# Patient Record
Sex: Male | Born: 1970
Health system: Southern US, Community
[De-identification: ages and names within clinical notes are randomized; demographics above are authoritative.]

## PROBLEM LIST (undated history)

## (undated) DIAGNOSIS — I10 Essential (primary) hypertension: Secondary | ICD-10-CM

---

## 2009-06-08 ENCOUNTER — Emergency Department: Payer: Self-pay | Admitting: Emergency Medicine

## 2019-03-26 ENCOUNTER — Encounter: Payer: Self-pay | Admitting: Emergency Medicine

## 2019-03-26 ENCOUNTER — Other Ambulatory Visit: Payer: Self-pay

## 2019-03-26 ENCOUNTER — Emergency Department: Payer: 59

## 2019-03-26 ENCOUNTER — Emergency Department
Admission: EM | Admit: 2019-03-26 | Discharge: 2019-03-26 | Disposition: A | Discharge status: Home / Self Care | Payer: 59 | Attending: Emergency Medicine | Admitting: Emergency Medicine

## 2019-03-26 DIAGNOSIS — I10 Essential (primary) hypertension: Secondary | ICD-10-CM

## 2019-03-26 DIAGNOSIS — F17210 Nicotine dependence, cigarettes, uncomplicated: Secondary | ICD-10-CM | POA: Insufficient documentation

## 2019-03-26 DIAGNOSIS — H81399 Other peripheral vertigo, unspecified ear: Secondary | ICD-10-CM | POA: Insufficient documentation

## 2019-03-26 DIAGNOSIS — R42 Dizziness and giddiness: Secondary | ICD-10-CM | POA: Diagnosis present

## 2019-03-26 HISTORY — DX: Essential (primary) hypertension: I10

## 2019-03-26 LAB — COMPREHENSIVE METABOLIC PANEL
ALT: 72 U/L — ABNORMAL HIGH (ref 0–44)
AST: 43 U/L — ABNORMAL HIGH (ref 15–41)
Albumin: 4.7 g/dL (ref 3.5–5.0)
Alkaline Phosphatase: 104 U/L (ref 38–126)
Anion gap: 10 (ref 5–15)
BUN: 14 mg/dL (ref 6–20)
CO2: 19 mmol/L — ABNORMAL LOW (ref 22–32)
Calcium: 9.4 mg/dL (ref 8.9–10.3)
Chloride: 108 mmol/L (ref 98–111)
Creatinine, Ser: 1.21 mg/dL (ref 0.61–1.24)
GFR calc Af Amer: >60 mL/min (ref >60)
GFR calc non Af Amer: >60 mL/min (ref >60)
Glucose, Bld: 122 mg/dL — ABNORMAL HIGH (ref 70–99)
Potassium: 4.7 mmol/L (ref 3.5–5.1)
Sodium: 137 mmol/L (ref 135–145)
Total Bilirubin: 1.5 mg/dL — ABNORMAL HIGH (ref 0.3–1.2)
Total Protein: 7.3 g/dL (ref 6.5–8.1)

## 2019-03-26 LAB — CBC WITH DIFFERENTIAL/PLATELET
Abs Immature Granulocytes: 0.03 10*3/uL (ref 0.00–0.07)
Basophils Absolute: 0 10*3/uL (ref 0.0–0.1)
Basophils Relative: 0 %
Eosinophils Absolute: 0.1 10*3/uL (ref 0.0–0.5)
Eosinophils Relative: 1 %
HCT: 46 % (ref 39.0–52.0)
Hemoglobin: 16.5 g/dL (ref 13.0–17.0)
Immature Granulocytes: 0 %
Lymphocytes Relative: 13 %
Lymphs Abs: 1 10*3/uL (ref 0.7–4.0)
MCH: 30.7 pg (ref 26.0–34.0)
MCHC: 35.9 g/dL (ref 30.0–36.0)
MCV: 85.7 fL (ref 80.0–100.0)
Monocytes Absolute: 0.4 10*3/uL (ref 0.1–1.0)
Monocytes Relative: 5 %
Neutro Abs: 6.1 10*3/uL (ref 1.7–7.7)
Neutrophils Relative %: 81 %
Platelets: 247 10*3/uL (ref 150–400)
RBC: 5.37 MIL/uL (ref 4.22–5.81)
RDW: 12.3 % (ref 11.5–15.5)
WBC: 7.6 10*3/uL (ref 4.0–10.5)
nRBC: 0 % (ref 0.0–0.2)

## 2019-03-26 LAB — URINALYSIS, COMPLETE (UACMP) WITH MICROSCOPIC
Bacteria, UA: NONE SEEN
Bilirubin Urine: NEGATIVE
Glucose, UA: NEGATIVE mg/dL
Hgb urine dipstick: NEGATIVE
Ketones, ur: NEGATIVE mg/dL
Leukocytes,Ua: NEGATIVE
Nitrite: NEGATIVE
Protein, ur: 30 mg/dL — AB
Specific Gravity, Urine: 1.027 (ref 1.005–1.030)
Squamous Epithelial / HPF: NONE SEEN (ref 0–5)
pH: 7 (ref 5.0–8.0)

## 2019-03-26 MED ORDER — MECLIZINE HCL 25 MG PO TABS
25.0000 mg | ORAL_TABLET | Freq: Once | ORAL | Status: AC
  Administered 2019-03-26: 25 mg via ORAL
  Filled 2019-03-26: qty 1

## 2019-03-26 MED ORDER — AMLODIPINE BESYLATE 5 MG PO TABS: 5.0000 mg | ORAL_TABLET | Freq: Every day | ORAL | 0 refills | Status: DC

## 2019-03-26 MED ORDER — MECLIZINE HCL 25 MG PO TABS: 25.0000 mg | ORAL_TABLET | Freq: Three times a day (TID) | ORAL | 0 refills | Status: DC | PRN

## 2019-03-26 MED ORDER — AMLODIPINE BESYLATE 5 MG PO TABS
5.0000 mg | ORAL_TABLET | Freq: Once | ORAL | Status: AC
  Administered 2019-03-26: 13:00:00 5 mg via ORAL
  Filled 2019-03-26: qty 1

## 2019-03-26 NOTE — ED Notes (Signed)
Pt ambulated down hallway with no apparent difficulty. Pt states he no longer feels dizzy when he is lying down and only slightly dizzy when ambulating. Pt alert & oriented, denies pain.

## 2019-03-26 NOTE — ED Provider Notes (Signed)
[facility] Emergency Department Provider Note   ____________________________________________   First MD Initiated Contact with Patient 03/26/19 1157     (approximate)  I have reviewed the triage vital signs and the nursing notes.   HISTORY  Chief Complaint Dizziness    HPI Ethan Lane is a 49 y.o. male with past medical history of hypertension who presents to the ED complaining of dizziness.  Patient reports that when he woke up this morning he was feeling very dizzy and unsteady on his feet with a sensation that the room was spinning around him.  It seemed to be worse when he stood up and tried to walk to the bathroom, had to lean against the wall to support himself.  He denies feeling lightheaded or like he was going to pass out, has not had any chest pain or shortness of breath.  Symptoms have persisted throughout the day today and seem to be worse with any movement of his head.  He does state that since he is arrived in the ED, he has been able to stand up and walk with only very mild dizziness.  He has not had any changes to his vision or speech and denies any focal numbness or weakness.  He does admit to a longstanding history of hypertension since he was a teenager, was only on medication for this briefly a couple of years ago.        Past Medical History:  Diagnosis Date  . Hypertension     There are no problems to display for this patient.   History reviewed. No pertinent surgical history.  Prior to Admission medications   Medication Sig Start Date End Date Taking? Authorizing Provider  amLODipine (NORVASC) 5 MG tablet Take 1 tablet (5 mg total) by mouth daily. 03/26/19 05/25/19  Chesley Noon, MD  meclizine (ANTIVERT) 25 MG tablet Take 1 tablet (25 mg total) by mouth 3 (three) times daily as needed for dizziness. 03/26/19   Chesley Noon, MD    Allergies Patient has no known allergies.  History reviewed. No pertinent family  history.  Social History Social History   Tobacco Use  . Smoking status: Current Every Day Smoker    Packs/day: 1.00    Types: Cigarettes  . Smokeless tobacco: Never Used  Substance Use Topics  . Alcohol use: Yes  . Drug use: Never    Review of Systems  Constitutional: No fever/chills Eyes: No visual changes. ENT: No sore throat. Cardiovascular: Denies chest pain. Respiratory: Denies shortness of breath. Gastrointestinal: No abdominal pain.  No nausea, no vomiting.  No diarrhea.  No constipation. Genitourinary: Negative for dysuria. Musculoskeletal: Negative for back pain. Skin: Negative for rash. Neurological: Negative for headaches, focal weakness or numbness.  Positive for dizziness.  ____________________________________________   PHYSICAL EXAM:  VITAL SIGNS: ED Triage Vitals  Enc Vitals Group     BP 03/26/19 0830 (!) 185/118     Pulse Rate 03/26/19 0830 81     Resp 03/26/19 0830 18     Temp 03/26/19 0830 98 F (36.7 C)     Temp Source 03/26/19 0830 Oral     SpO2 03/26/19 0830 99 %     Weight 03/26/19 0851 200 lb (90.7 kg)     Height 03/26/19 0851 5\' 10"  (1.778 m)     Head Circumference --      Peak Flow --      Pain Score 03/26/19 0850 0     Pain Loc --  Pain Edu? --      Excl. in [place]? --     Constitutional: Alert and oriented. Eyes: Conjunctivae are normal.  Pupils equal round and reactive to light bilaterally, extraocular movements intact without nystagmus. Head: Atraumatic. Nose: No congestion/rhinnorhea. Mouth/Throat: Mucous membranes are moist. Neck: Normal ROM Cardiovascular: Normal rate, regular rhythm. Grossly normal heart sounds. Respiratory: Normal respiratory effort.  No retractions. Lungs CTAB. Gastrointestinal: Soft and nontender. No distention. Genitourinary: deferred Musculoskeletal: No lower extremity tenderness nor edema. Neurologic:  Normal speech and language. No gross focal neurologic deficits are appreciated.  5-5 strength in  bilateral upper and lower extremities, no pronator drift. Skin:  Skin is warm, dry and intact. No rash noted. Psychiatric: Mood and affect are normal. Speech and behavior are normal.  ____________________________________________   LABS (all labs ordered are listed, but only abnormal results are displayed)  Labs Reviewed  URINALYSIS, COMPLETE (UACMP) WITH MICROSCOPIC - Abnormal; Notable for the following components:      Result Value   Color, Urine YELLOW (*)    APPearance CLEAR (*)    Protein, ur 30 (*)    All other components within normal limits  COMPREHENSIVE METABOLIC PANEL - Abnormal; Notable for the following components:   CO2 19 (*)    Glucose, Bld 122 (*)    AST 43 (*)    ALT 72 (*)    Total Bilirubin 1.5 (*)    All other components within normal limits  CBC WITH DIFFERENTIAL/PLATELET   ____________________________________________  EKG  ED ECG REPORT I, Chesley Noon, the attending physician, personally viewed and interpreted this ECG.   Date: 03/26/2019  EKG Time: 8:37  Rate: 75  Rhythm: normal sinus rhythm  Axis: Normal  Intervals:none  ST&T Change: None   PROCEDURES  Procedure(s) performed (including Critical Care):  Procedures   ____________________________________________   INITIAL IMPRESSION / ASSESSMENT AND PLAN / ED COURSE       49 year old male with longstanding history of unmanaged hypertension presents to the ED with episodes of dizziness and a sensation of the room spinning around him since waking up this morning.  He describes the symptoms as worse with any movement of his head, but they have gradually improved following his arrival here in the ED.  Doubt cardiac etiology as his EKG shows no signs of arrhythmia or ischemia, he remains in normal sinus rhythm on the monitor.  CT head is negative for acute process and he has no focal neurologic deficits on exam, given association of symptoms with head position, suspect peripheral vertigo  rather than central etiology.  Will treat with meclizine and reevaluate.  He does have a markedly elevated blood pressure here, however there is no evidence of hypertensive emergency and I suspect he has been dealing with blood pressure this high for a long time.  He would benefit from starting on amlodipine, but there is no indication for more aggressive management of his blood pressure here in the ED.  Counseled patient to establish care with a PCP for follow-up of his blood pressure, patient agrees with plan.      ____________________________________________   FINAL CLINICAL IMPRESSION(S) / ED DIAGNOSES  Final diagnoses:  Peripheral vertigo, unspecified laterality  Hypertension, unspecified type     ED Discharge Orders         Ordered    meclizine (ANTIVERT) 25 MG tablet  3 times daily PRN     03/26/19 1428    amLODipine (NORVASC) 5 MG tablet  Daily  03/26/19 1428           Note:  This document was prepared using Dragon voice recognition software and may include unintentional dictation errors.   Blake Divine, MD 03/26/19 [phone]

## 2019-03-26 NOTE — ED Triage Notes (Signed)
First RN Note: Pt presents to ED via [facility]. Per EMS pt takes low dose BP meds x 7-8 yrs, recently went on vacation and didn't take his BP meds, also recently lost his son. Per EMS at approx 0100 this morning pt began having sudden onset dizziness, blurred vision, N/V. PTA pt's BP 200/130. Per EMS pt denies HA at this time.    182/112 70 NSR

## 2019-03-26 NOTE — ED Notes (Signed)
AAOx3.  Skin warm and dry.  NAD 

## 2020-08-16 IMAGING — CT CT HEAD W/O CM
3 series · 15 of 47 positions shown, 18 images · non-contrast
Comparison: None

CLINICAL DATA: Sudden onset of dizziness, blurred vision, nausea,
vomiting, elevated blood pressure, has not been taking blood
pressure medications

EXAM:
CT HEAD WITHOUT CONTRAST
TECHNIQUE: Contiguous axial images were obtained from the base of the skull
through the vertex without intravenous contrast. Sagittal and
coronal MPR images reconstructed from axial data set.

[Series 3: head wo · axial · 0.48mm/px · z∈[-144,-14]mm · 9 of 32 slices shown, 12 images]
[im 3/32  brain]
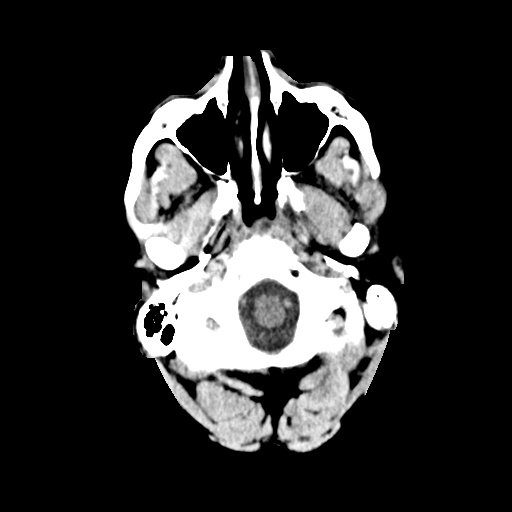
[im 3/32  bone]
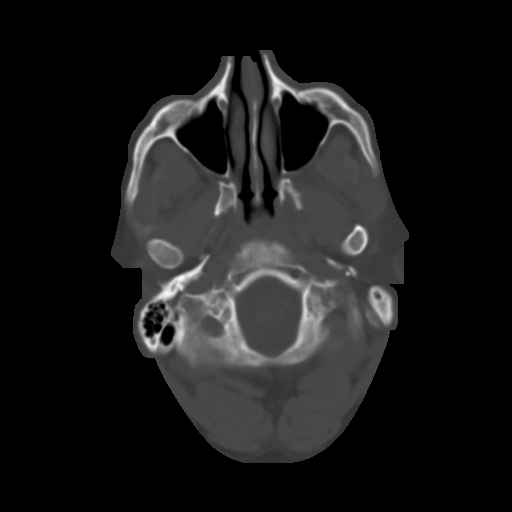
[im 6/32  brain]
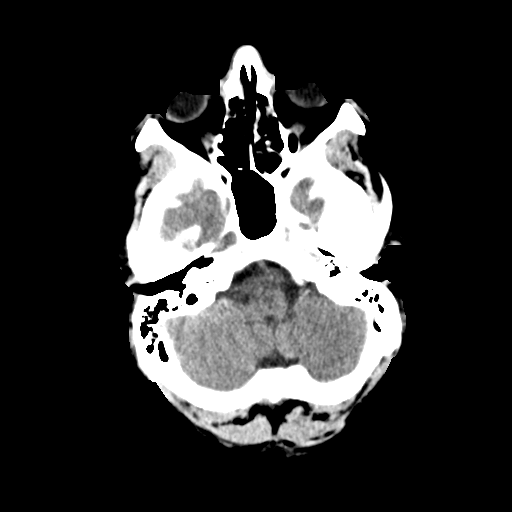
[im 9/32  brain]
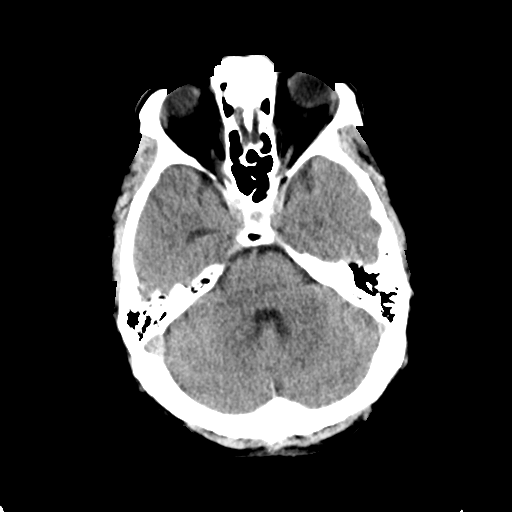
[im 12/32  brain]
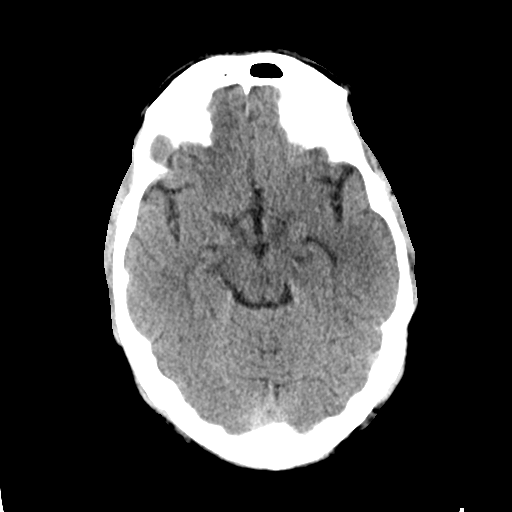
[im 17/32  brain]
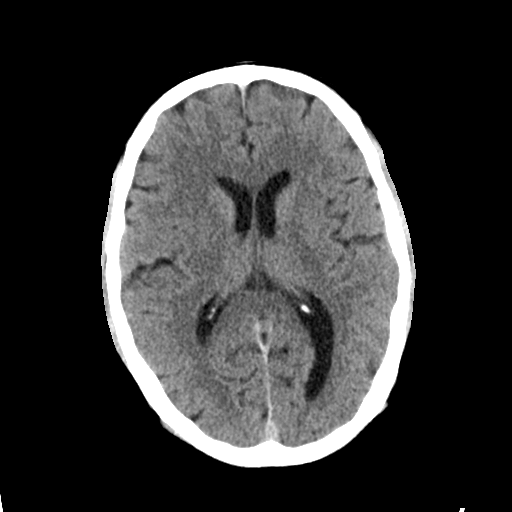
[im 17/32  bone]
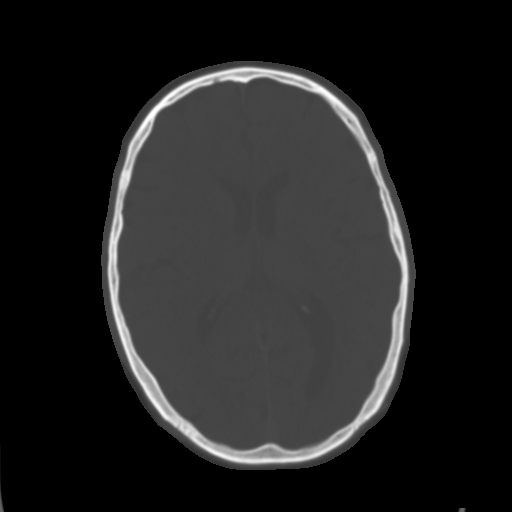
[im 20/32  brain]
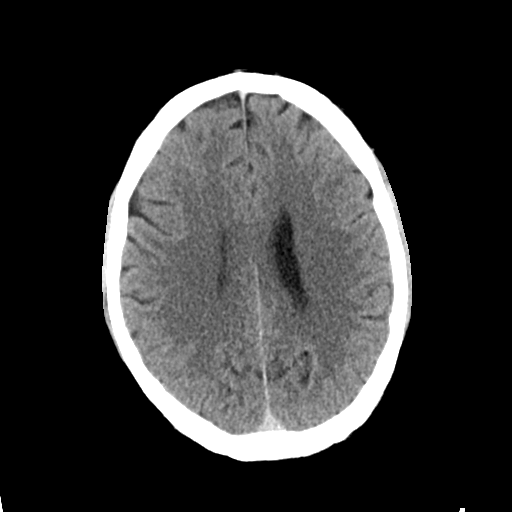
[im 23/32  brain]
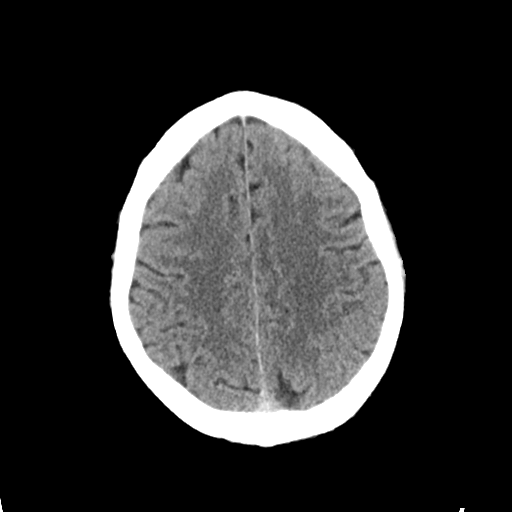
[im 26/32  brain]
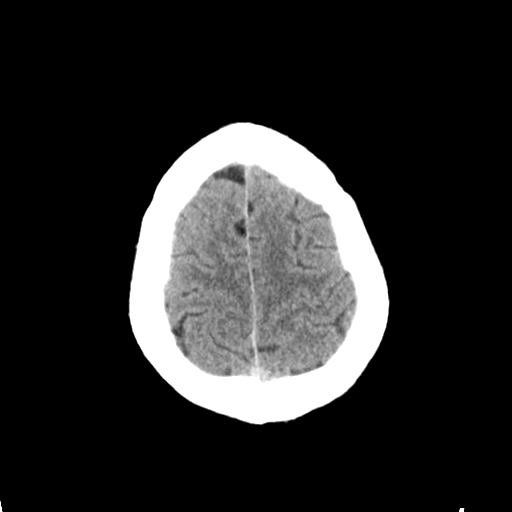
[im 29/32  brain]
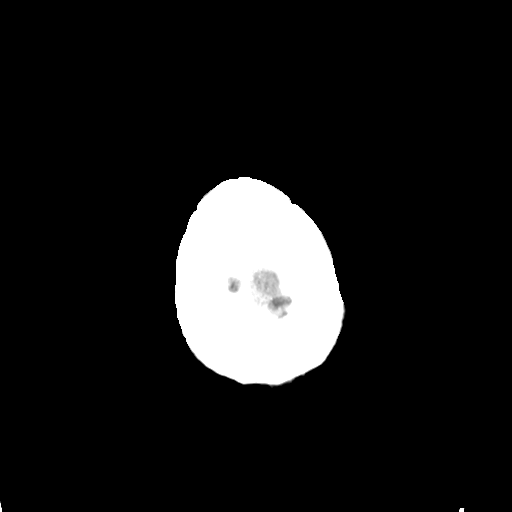
[im 29/32  bone]
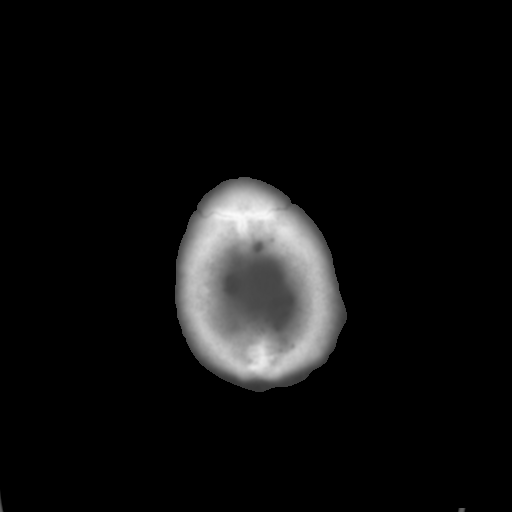

[Series 4: coronal soft tissue · coronal · 0.34mm/px · 3 of 72 slices shown]
[im 24/72  brain]
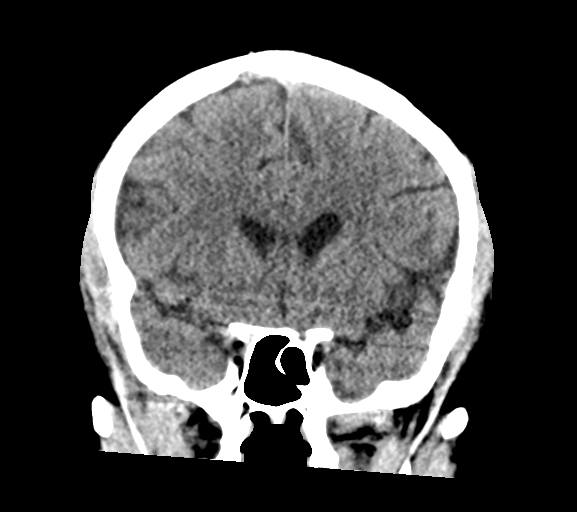
[im 32/72  brain]
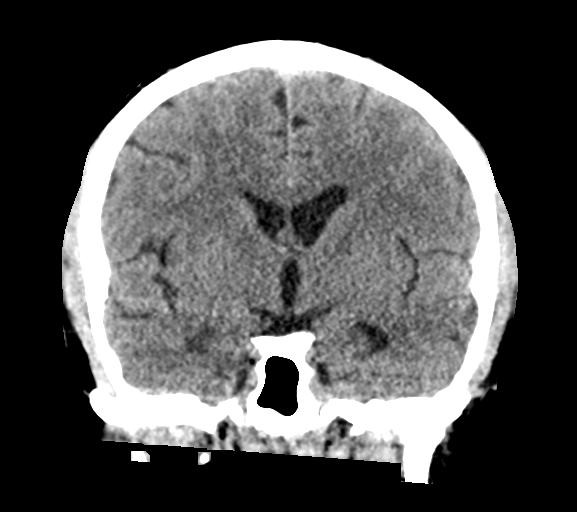
[im 40/72  brain]
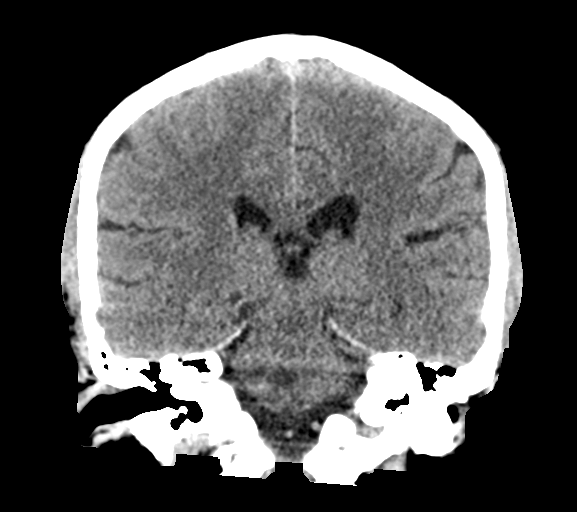

[Series 5: sagittal soft tissue · sagittal · 0.35mm/px · 3 of 57 slices shown]
[im 19/57  brain]
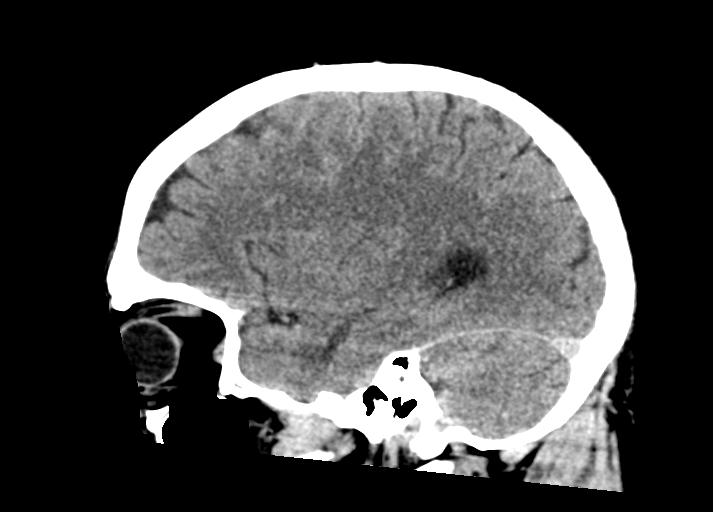
[im 29/57  brain]
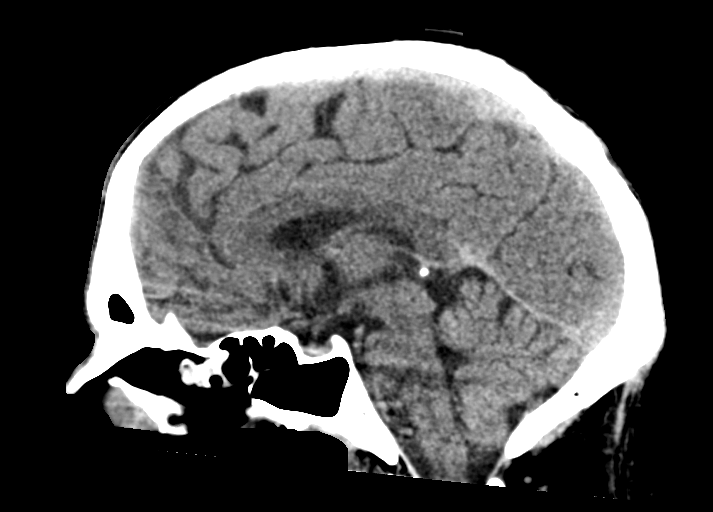
[im 38/57  brain]
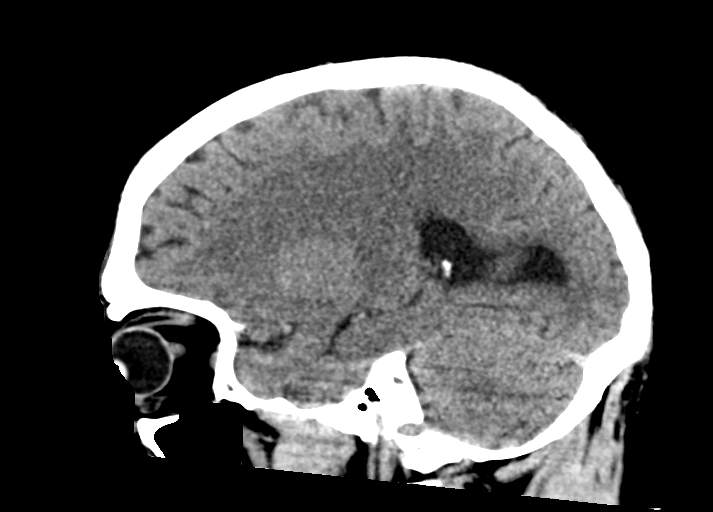

[15 of 47 positions shown; findings below may reference images not displayed]

FINDINGS: Brain: Normal ventricular morphology. No midline shift or mass
effect. Normal appearance of brain parenchyma. No intracranial
hemorrhage, mass lesion, evidence of acute infarction, or
extra-axial fluid collection.

Vascular: No hyperdense vessels

Skull: Intact

Sinuses/Orbits: Minimal mucosal thickening in anterior ethmoid air
cells. Remaining visualized paranasal sinuses and mastoid air cells
clear.

Other: N/A
IMPRESSION: No acute intracranial abnormalities.

## 2022-05-05 ENCOUNTER — Other Ambulatory Visit: Payer: Self-pay

## 2022-05-05 ENCOUNTER — Emergency Department
Admission: EM | Admit: 2022-05-05 | Discharge: 2022-05-05 | Disposition: A | Discharge status: Home / Self Care | Payer: BC Managed Care – PPO | Attending: Emergency Medicine | Admitting: Emergency Medicine

## 2022-05-05 ENCOUNTER — Encounter: Payer: Self-pay | Admitting: Intensive Care

## 2022-05-05 ENCOUNTER — Emergency Department: Payer: BC Managed Care – PPO

## 2022-05-05 DIAGNOSIS — R42 Dizziness and giddiness: Secondary | ICD-10-CM | POA: Diagnosis not present

## 2022-05-05 DIAGNOSIS — I10 Essential (primary) hypertension: Secondary | ICD-10-CM | POA: Diagnosis present

## 2022-05-05 DIAGNOSIS — H81399 Other peripheral vertigo, unspecified ear: Secondary | ICD-10-CM

## 2022-05-05 LAB — CBC
HCT: 45.1 % (ref 39.0–52.0)
Hemoglobin: 15.8 g/dL (ref 13.0–17.0)
MCH: 29.6 pg (ref 26.0–34.0)
MCHC: 35 g/dL (ref 30.0–36.0)
MCV: 84.6 fL (ref 80.0–100.0)
Platelets: 222 10*3/uL (ref 150–400)
RBC: 5.33 MIL/uL (ref 4.22–5.81)
RDW: 12.1 % (ref 11.5–15.5)
WBC: 7.5 10*3/uL (ref 4.0–10.5)
nRBC: 0 % (ref 0.0–0.2)

## 2022-05-05 LAB — BASIC METABOLIC PANEL
Anion gap: 12 (ref 5–15)
BUN: 16 mg/dL (ref 6–20)
CO2: 19 mmol/L — ABNORMAL LOW (ref 22–32)
Calcium: 9.1 mg/dL (ref 8.9–10.3)
Chloride: 105 mmol/L (ref 98–111)
Creatinine, Ser: 1.14 mg/dL (ref 0.61–1.24)
GFR, Estimated: >60 mL/min (ref >60)
Glucose, Bld: 156 mg/dL — ABNORMAL HIGH (ref 70–99)
Potassium: 3.9 mmol/L (ref 3.5–5.1)
Sodium: 136 mmol/L (ref 135–145)

## 2022-05-05 LAB — TROPONIN I (HIGH SENSITIVITY): Troponin I (High Sensitivity): 3 ng/L (ref <18)

## 2022-05-05 MED ORDER — MECLIZINE HCL 25 MG PO TABS
25.0000 mg | ORAL_TABLET | Freq: Once | ORAL | Status: AC
  Administered 2022-05-05: 25 mg via ORAL
  Filled 2022-05-05: qty 1

## 2022-05-05 MED ORDER — AMLODIPINE BESYLATE 5 MG PO TABS: 5.0000 mg | ORAL_TABLET | Freq: Every day | ORAL | 0 refills | Status: AC

## 2022-05-05 MED ORDER — AMLODIPINE BESYLATE 5 MG PO TABS
5.0000 mg | ORAL_TABLET | Freq: Once | ORAL | Status: AC
  Administered 2022-05-05: 5 mg via ORAL
  Filled 2022-05-05: qty 1

## 2022-05-05 MED ORDER — MECLIZINE HCL 25 MG PO TABS: 25.0000 mg | ORAL_TABLET | Freq: Three times a day (TID) | ORAL | 0 refills | Status: AC | PRN

## 2022-05-05 NOTE — ED Triage Notes (Signed)
First Nurse Note;  Pt via EMS from [facility] [employer]. Pt report sudden NV. EMS gave 13m of Zofran. Wife reports that he suppose to be on medications for HTN but husband states he does not. Pt is A&Ox4 and NAD 224/114 93 HR  98% on RA

## 2022-05-05 NOTE — ED Triage Notes (Signed)
Arrived by EMS. Patient c/o dizziness and diaphoretic. Reports nausea. Denies headache or vision changes.   History HTN. Has not taken b/p meds in years.   EMS gave 51m zofran

## 2022-05-05 NOTE — ED Provider Notes (Signed)
[facility] Provider Note    Event Date/Time   First MD Initiated Contact with Patient 05/05/22 1614     (approximate)   History   Chief Complaint Hypertension   HPI  Ethan Lane is a 52 y.o. male with past medical history of hypertension who presents to the ED complaining of dizziness.  Patient reports that he had sudden onset of dizziness a couple of hours ago while sitting down to have lunch.  He describes it as feeling like he is swaying and unsteady on his feet, worse when he goes to stand up or move his head.  He feels like the symptoms improve if he is sitting still with his eyes closed, denies any lightheadedness or feeling like he is going to pass out.  He has not had any vision changes, speech changes, numbness, or weakness.  He does describe a similar episode in the past, when he was diagnosed with vertigo.  He also reports that his blood pressure has been running high, has been previously prescribed medication for this but does not take it or see a PCP.     Physical Exam   Triage Vital Signs: ED Triage Vitals  Enc Vitals Group     BP 05/05/22 1417 (!) 186/117     Pulse Rate 05/05/22 1417 64     Resp 05/05/22 1417 16     Temp 05/05/22 1420 (!) 97.4 F (36.3 C)     Temp Source 05/05/22 1420 Oral     SpO2 05/05/22 1417 99 %     Weight 05/05/22 1418 190 lb (86.2 kg)     Height 05/05/22 1418 5' 10.5" (1.791 m)     Head Circumference --      Peak Flow --      Pain Score 05/05/22 1417 0     Pain Loc --      Pain Edu? --      Excl. in [place]? --     Most recent vital signs: Vitals:   05/05/22 1635 05/05/22 1636  BP: (!) 192/102   Pulse:  75  Resp:  17  Temp:    SpO2:  100%    Constitutional: Alert and oriented. Eyes: Conjunctivae are normal.  Pupils equal, round, and reactive to light bilaterally. Head: Atraumatic. Nose: No congestion/rhinnorhea. Mouth/Throat: Mucous membranes are moist.  Cardiovascular: Normal rate, regular  rhythm. Grossly normal heart sounds.  2+ radial pulses bilaterally. Respiratory: Normal respiratory effort.  No retractions. Lungs CTAB. Gastrointestinal: Soft and nontender. No distention. Musculoskeletal: No lower extremity tenderness nor edema.  Neurologic:  Normal speech and language. No gross focal neurologic deficits are appreciated.    ED Results / Procedures / Treatments   Labs (all labs ordered are listed, but only abnormal results are displayed) Labs Reviewed  BASIC METABOLIC PANEL - Abnormal; Notable for the following components:      Result Value   CO2 19 (*)    Glucose, Bld 156 (*)    All other components within normal limits  CBC  TROPONIN I (HIGH SENSITIVITY)     EKG  ED ECG REPORT I, Blake Divine, the attending physician, personally viewed and interpreted this ECG.   Date: 05/05/2022  EKG Time: 14:32  Rate: 57  Rhythm: sinus bradycardia  Axis: Normal  Intervals:none  ST&T Change: None  RADIOLOGY Chest x-ray reviewed and interpreted by me with no infiltrate, edema, or effusion.  PROCEDURES:  Critical Care performed: No  Procedures   MEDICATIONS  ORDERED IN ED: Medications  meclizine (ANTIVERT) tablet 25 mg (25 mg Oral Given 05/05/22 1635)  amLODipine (NORVASC) tablet 5 mg (5 mg Oral Given 05/05/22 1635)     IMPRESSION / MDM / ASSESSMENT AND PLAN / ED COURSE  I reviewed the triage vital signs and the nursing notes.                              52 y.o. male with past medical history of hypertension who presents to the ED complaining of sudden onset dizziness about 2 hours prior to arrival that is worse with changes in position.  Patient's presentation is most consistent with acute presentation with potential threat to life or bodily function.  Differential diagnosis includes, but is not limited to, stroke, TIA, intracranial hemorrhage, peripheral vertigo, electrolyte abnormality, AKI, hypertensive emergency.  Patient nontoxic-appearing and  in no acute distress, vital signs are remarkable for elevated blood pressure but otherwise reassuring.  EKG shows no evidence of arrhythmia or ischemia and troponin is negative, doubt ACS or other cardiac etiology.  Remainder of labs are reassuring with no significant anemia, leukocytosis, electrode abnormality, or AKI.  Symptoms seem most consistent with a peripheral vertigo and we will treat with meclizine, but also check CT head given elevated blood pressure.  He has a long history of hypertension, has been seen in the ED for this previously and started on amlodipine but is not currently taking it.  We will give his home dose of amlodipine and reassess following imaging.  CT head is negative for acute process, patient reports feeling better following dose of meclizine.  He is able to stand and ambulate without difficulty, minimal dizziness noted at this time.  Suspect symptoms are due to peripheral vertigo and he is appropriate for discharge home with PCP follow-up for recheck of his blood pressure.  We will restart him on amlodipine and he was counseled to return to the ED for new or worsening symptoms, patient agrees with plan.      FINAL CLINICAL IMPRESSION(S) / ED DIAGNOSES   Final diagnoses:  Dizziness  Uncontrolled hypertension  Peripheral vertigo, unspecified laterality     Rx / DC Orders   ED Discharge Orders          Ordered    amLODipine (NORVASC) 5 MG tablet  Daily        05/05/22 1726    meclizine (ANTIVERT) 25 MG tablet  3 times daily PRN        05/05/22 1726             Note:  This document was prepared using Dragon voice recognition software and may include unintentional dictation errors.   Blake Divine, MD 05/05/22 1728
